# Patient Record
Sex: Female | Born: 1995 | Race: White | Hispanic: No | Marital: Single | Smoking: Never smoker
Health system: Southern US, Community
[De-identification: ages and names within clinical notes are randomized; demographics above are authoritative.]

---
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
## 2017-11-07 IMAGING — US US OB TRANSVAGINAL
1 series · 14 of 28 positions shown · non-contrast
Comparison: None.

CLINICAL DATA: Bleeding and cramping for 2 days

EXAM:
OBSTETRIC <14 WK US AND TRANSVAGINAL OB US
TECHNIQUE: Both transabdominal and transvaginal ultrasound examinations were
performed for complete evaluation of the gestation as well as the
maternal uterus, adnexal regions, and pelvic cul-de-sac.
Transvaginal technique was performed to assess early pregnancy.

[Series 1: us ob transvaginal · 0.13mm/px · 14 of 45 slices shown]
[im 2/45]
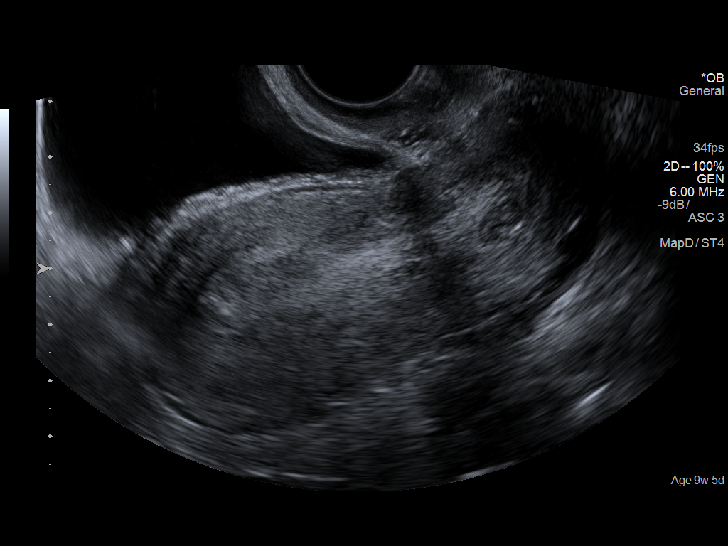
[im 5/45]
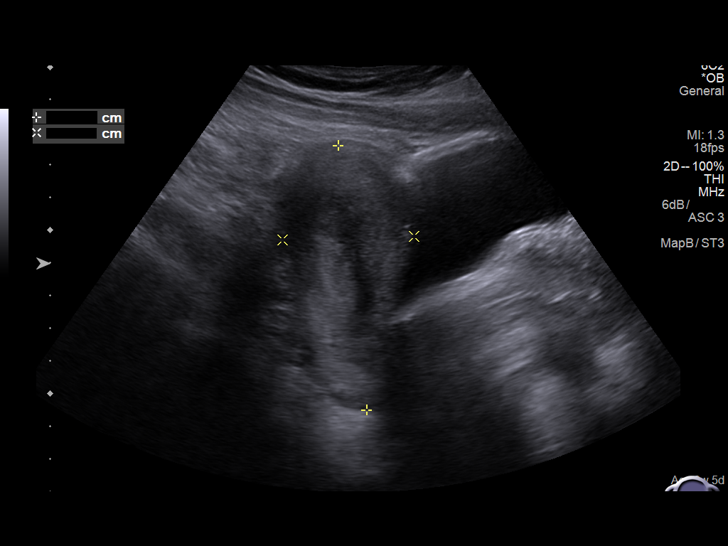
[im 9/45]
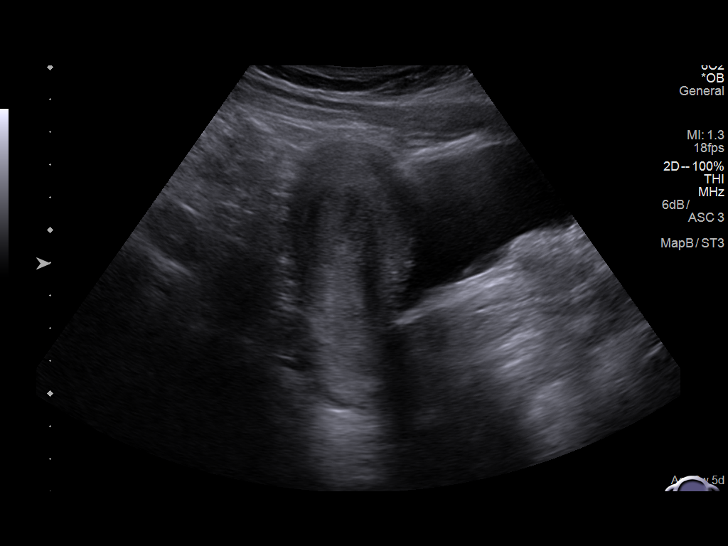
[im 12/45]
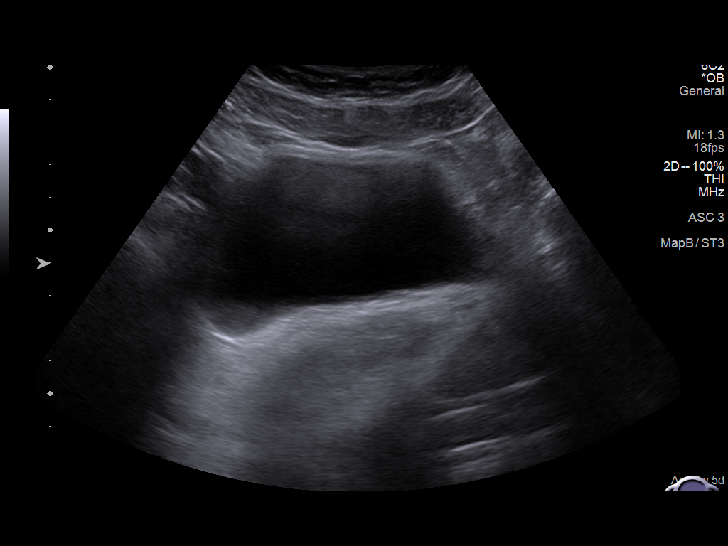
[im 15/45]
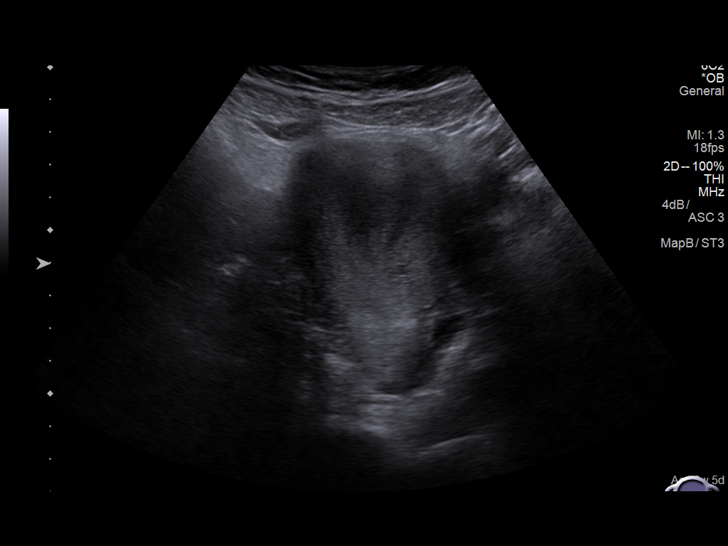
[im 18/45]
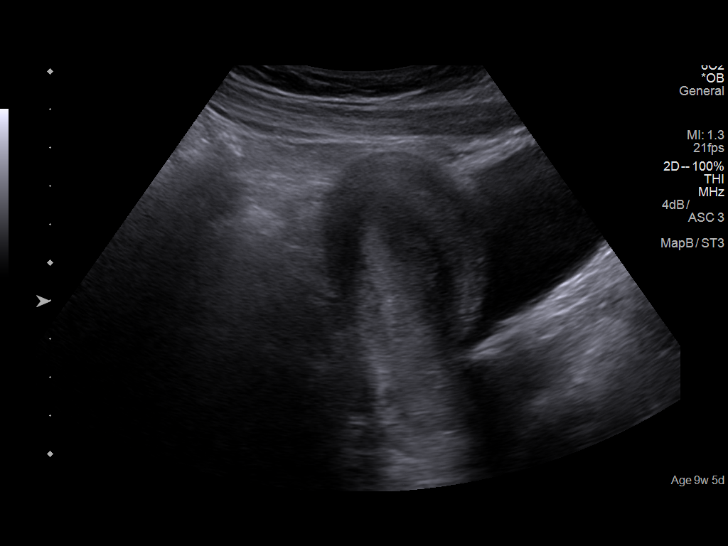
[im 22/45]
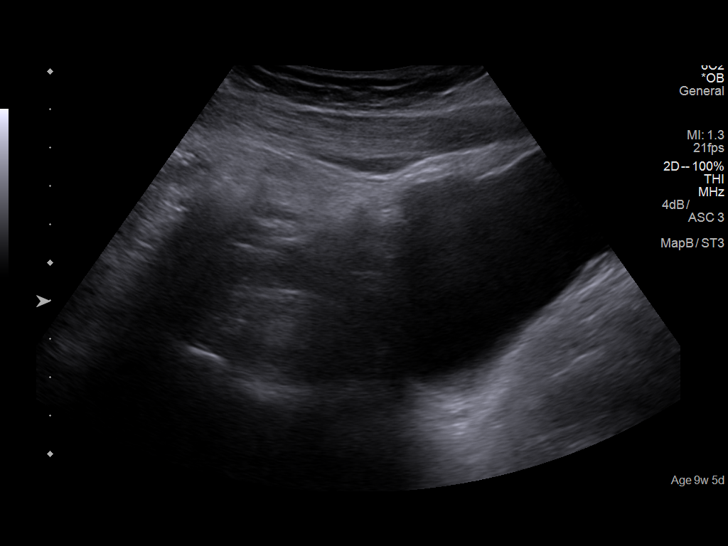
[im 25/45]
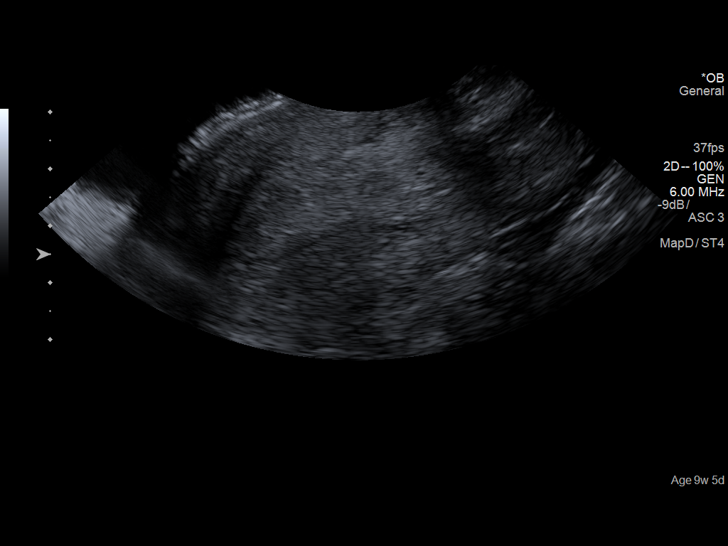
[im 28/45]
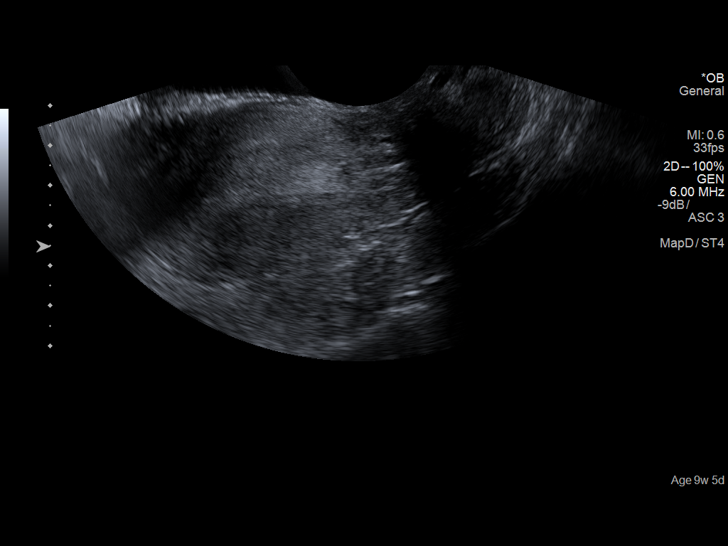
[im 31/45]
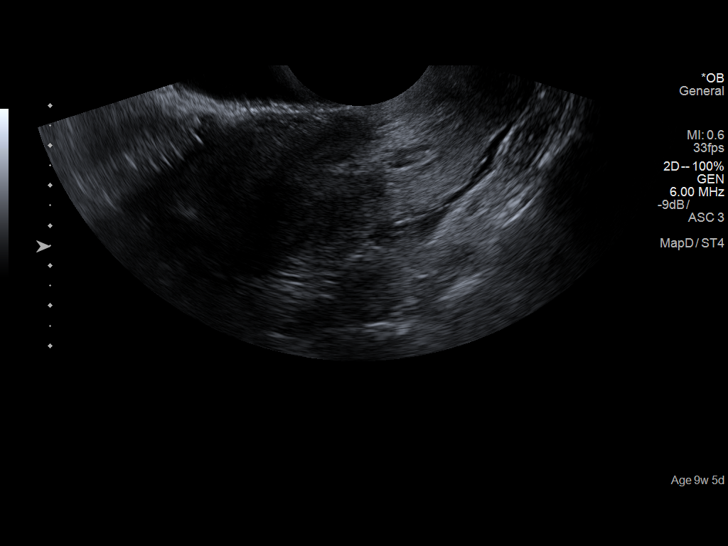
[im 35/45]
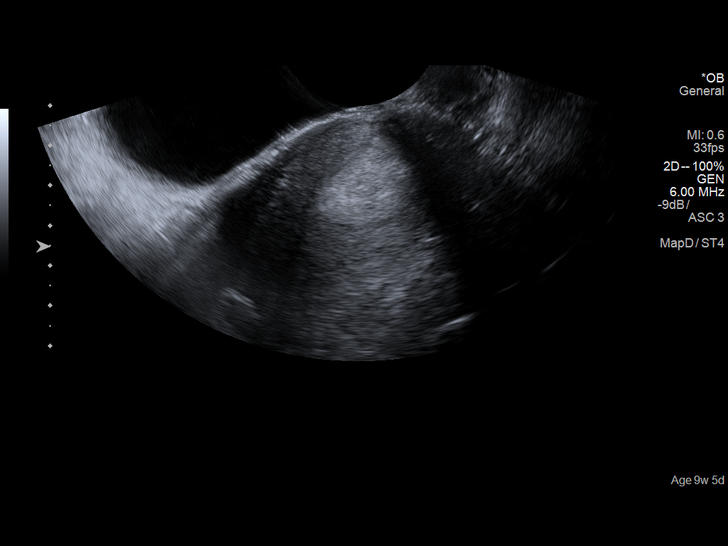
[im 38/45]
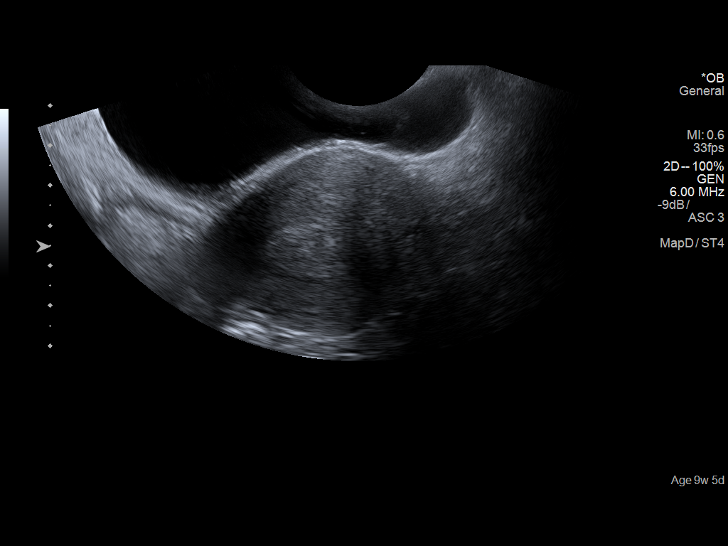
[im 41/45]
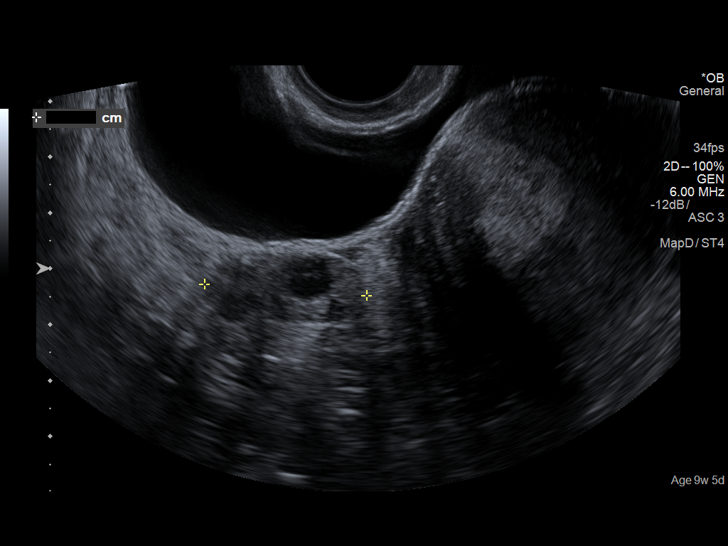
[im 45/45]
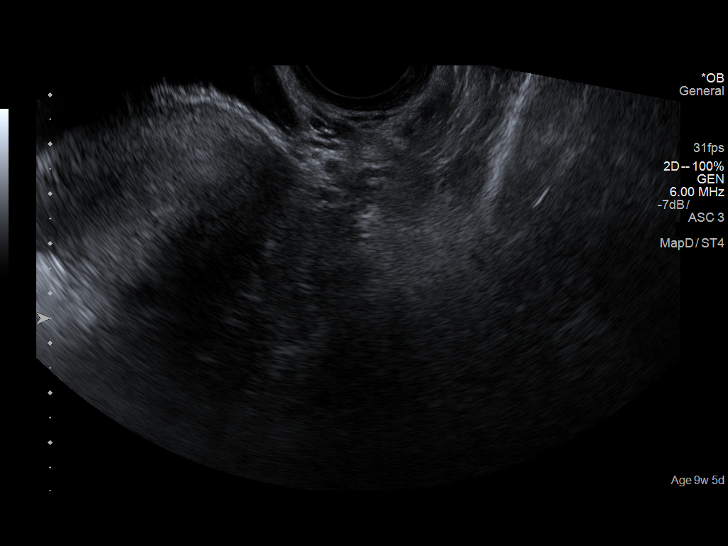

[14 of 28 positions shown; findings below may reference images not displayed]

FINDINGS: Intrauterine gestational sac: Not visualized

Yolk sac:  Not visualized

Embryo:  Not visualized

Subchorionic hemorrhage:  None visualized.

Maternal uterus/adnexae: No adnexal masses. Small amount of free
fluid in the cul-de-sac.
IMPRESSION: No intrauterine pregnancy visualized. Differential considerations
would include early intrauterine pregnancy too early to visualize,
spontaneous abortion, or occult ectopic pregnancy. Recommend close
clinical followup and serial quantitative beta HCGs and ultrasounds.
